# Patient Record
Sex: Female | Born: 1995 | Race: Black or African American | Hispanic: No | Marital: Single | Smoking: Never smoker
Health system: Southern US, Community
[De-identification: ages and names within clinical notes are randomized; demographics above are authoritative.]

---

## 2020-08-27 ENCOUNTER — Other Ambulatory Visit: Payer: Self-pay

## 2020-08-27 ENCOUNTER — Encounter (HOSPITAL_COMMUNITY): Payer: Self-pay

## 2020-08-27 ENCOUNTER — Emergency Department (HOSPITAL_COMMUNITY)
Admission: EM | Admit: 2020-08-27 | Discharge: 2020-08-27 | Disposition: A | Discharge status: Home / Self Care | Payer: Medicaid Other | Attending: Emergency Medicine | Admitting: Emergency Medicine

## 2020-08-27 ENCOUNTER — Emergency Department (HOSPITAL_COMMUNITY): Payer: Medicaid Other

## 2020-08-27 DIAGNOSIS — J069 Acute upper respiratory infection, unspecified: Secondary | ICD-10-CM | POA: Diagnosis not present

## 2020-08-27 DIAGNOSIS — R0981 Nasal congestion: Secondary | ICD-10-CM | POA: Diagnosis present

## 2020-08-27 DIAGNOSIS — Z20822 Contact with and (suspected) exposure to covid-19: Secondary | ICD-10-CM | POA: Diagnosis not present

## 2020-08-27 LAB — RESP PANEL BY RT-PCR (FLU A&B, COVID) ARPGX2
Influenza A by PCR: NEGATIVE
Influenza B by PCR: NEGATIVE
SARS Coronavirus 2 by RT PCR: NEGATIVE

## 2020-08-27 NOTE — ED Provider Notes (Signed)
[facility]-EMERGENCY DEPT Provider Note   CSN: [id number] Arrival date & time: 08/27/20  1527     History Chief Complaint  Patient presents with  . Shortness of Breath  . Headache  . Nasal Congestion    Breanna Randolph is a 24 y.o. female.  Breanna Randolph is a 24 y.o. female who is otherwise healthy, presents to the emergency department for evaluation of 2 days of nasal congestion, rhinorrhea, and headache.  Patient reports that she has been short of breath, but states this is because she is not able to breathe through her nose, she does not feel shortness of breath in her chest or any chest pain or tightness.  Has only had a very occasional cough.  Reports that she did have some mild sore throat when symptoms were just beginning.  Has had some chills but no measured fevers.  Patient reports that her son has been sick with similar symptoms.  She has not had her Covid vaccine, states she was scared to get it because her sister got Covid shortly after being vaccinated.  She denies any known sick contacts.  No other aggravating or alleviating factors.        History reviewed. No pertinent past medical history.  There are no problems to display for this patient.   History reviewed. No pertinent surgical history.   OB History   No obstetric history on file.     History reviewed. No pertinent family history.  Social History   Tobacco Use  . Smoking status: Never Smoker  . Smokeless tobacco: Never Used  Vaping Use  . Vaping Use: Never used  Substance Use Topics  . Alcohol use: Never  . Drug use: Never    Home Medications Prior to Admission medications   Not on File    Allergies    Patient has no known allergies.  Review of Systems   Review of Systems  Constitutional: Negative for chills and fever.  HENT: Positive for congestion, rhinorrhea, sinus pressure and sore throat.   Respiratory: Negative for cough and shortness of  breath.   Cardiovascular: Negative for chest pain.  Gastrointestinal: Negative for abdominal pain, nausea and vomiting.  Musculoskeletal: Negative for myalgias.  Neurological: Positive for headaches.  All other systems reviewed and are negative.   Physical Exam Updated Vital Signs BP (!) 133/91   Pulse 80   Temp 98 F (36.7 C) (Oral)   Resp 17   Ht 5\' 7"  (1.702 m)   Wt (!) 138.3 kg   LMP 08/27/2020 (Approximate)   SpO2 100%   BMI 47.77 kg/m   Physical Exam Vitals and nursing note reviewed.  Constitutional:      General: She is not in acute distress.    Appearance: Normal appearance. She is well-developed. She is obese. She is not ill-appearing or diaphoretic.     Comments: Well-appearing and in no distress  HENT:     Head: Normocephalic and atraumatic.     Right Ear: Tympanic membrane and ear canal normal.     Left Ear: Tympanic membrane normal.     Nose: Congestion and rhinorrhea present.     Mouth/Throat:     Mouth: Mucous membranes are moist.     Pharynx: Oropharynx is clear.     Comments: Posterior oropharynx clear and mucous membranes moist, there is mild erythema but no edema or tonsillar exudates, uvula midline, normal phonation, no trismus, tolerating secretions without difficulty. Eyes:     General:  Right eye: No discharge.        Left eye: No discharge.  Neck:     Comments: No rigidity Cardiovascular:     Rate and Rhythm: Normal rate and regular rhythm.     Heart sounds: Normal heart sounds.  Pulmonary:     Effort: Pulmonary effort is normal. No respiratory distress.     Breath sounds: Normal breath sounds.     Comments: Respirations equal and unlabored, patient able to speak in full sentences, lungs clear to auscultation bilaterally Abdominal:     General: Bowel sounds are normal. There is no distension.     Palpations: Abdomen is soft. There is no mass.     Tenderness: There is no abdominal tenderness. There is no guarding.     Comments:  Abdomen soft, nondistended, nontender to palpation in all quadrants without guarding or peritoneal signs  Musculoskeletal:        General: No deformity.     Cervical back: Neck supple.  Lymphadenopathy:     Cervical: No cervical adenopathy.  Skin:    General: Skin is warm and dry.     Capillary Refill: Capillary refill takes less than 2 seconds.  Neurological:     Mental Status: She is alert and oriented to person, place, and time.  Psychiatric:        Mood and Affect: Mood normal.        Behavior: Behavior normal.     ED Results / Procedures / Treatments   Labs (all labs ordered are listed, but only abnormal results are displayed) Labs Reviewed  RESP PANEL BY RT-PCR (FLU A&B, COVID) [id number]    EKG None  Radiology DG Chest 2 View  Result Date: 08/27/2020 CLINICAL DATA:  Shortness of breath and headache with nasal congestion. EXAM: CHEST - 2 VIEW COMPARISON:  None. FINDINGS: Lungs are clear. Cardiomediastinal silhouette is normal. Prominent overlying soft tissues. Bony structures are normal. IMPRESSION: No active cardiopulmonary disease. Electronically Signed   By: Elberta Fortis M.D.   On: 08/27/2020 16:46    Procedures Procedures (including critical care time)  Medications Ordered in ED Medications - No data to display  ED Course  I have reviewed the triage vital signs and the nursing notes.  Pertinent labs & imaging results that were available during my care of the patient were reviewed by me and considered in my medical decision making (see chart for details).    MDM Rules/Calculators/A&P                         Pt presents with nasal congestion and cough. Pt is well appearing and vitals are normal. Lungs CTA on exam. Pt CXR negative for acute infiltrate. Patients symptoms are consistent with URI, likely viral etiology. Covid and flu tests pending. Discussed that antibiotics are not indicated for viral infections. Pt will be discharged with symptomatic treatment.   Verbalizes understanding and is agreeable with plan. Pt is hemodynamically stable & in NAD prior to dc. Return precautions discussed, pt expresses understanding and agrees with plan.   Final Clinical Impression(s) / ED Diagnoses Final diagnoses:  Upper respiratory tract infection, unspecified type    Rx / DC Orders ED Discharge Orders    None       Dartha Lodge, [place] 08/28/20 [id number]    Milagros Loll, MD 08/28/20 [phone]

## 2020-08-27 NOTE — ED Triage Notes (Signed)
Patient c/o SOB, headache, and nasal congestion. patient states she was scheduled to be tested for Covid in 2 days.

## 2020-08-27 NOTE — Discharge Instructions (Signed)
Your symptoms are likely caused by a viral upper respiratory infection.  You have Covid and flu test pending will be called by phone if any of these results are positive.  Antibiotics are not helpful in treating viral infection, the virus should run its course in about 5-7 days. Please make sure you are drinking plenty of fluids. You can treat your symptoms supportively with tylenol/ibuprofen for fevers and pains, Sudafed, or other over-the-counter decongestants, Zyrtec and Flonase to help with nasal congestion, and over the counter cough syrups like Mucinex and throat lozenges to help with cough. If your symptoms are not improving please follow up with you Primary doctor.   If you develop persistent fevers, shortness of breath or difficulty breathing, chest pain, severe headache and neck pain, persistent nausea and vomiting or other new or concerning symptoms return to the Emergency department.  If your Covid test is negative today I highly encourage you to get vaccinated for Covid.

## 2022-05-03 IMAGING — CR DG CHEST 2V
2 series · 2 of 2 positions shown · non-contrast
Comparison: None.

CLINICAL DATA: Shortness of breath and headache with nasal
congestion.

EXAM:
CHEST - 2 VIEW

[w chest pa]
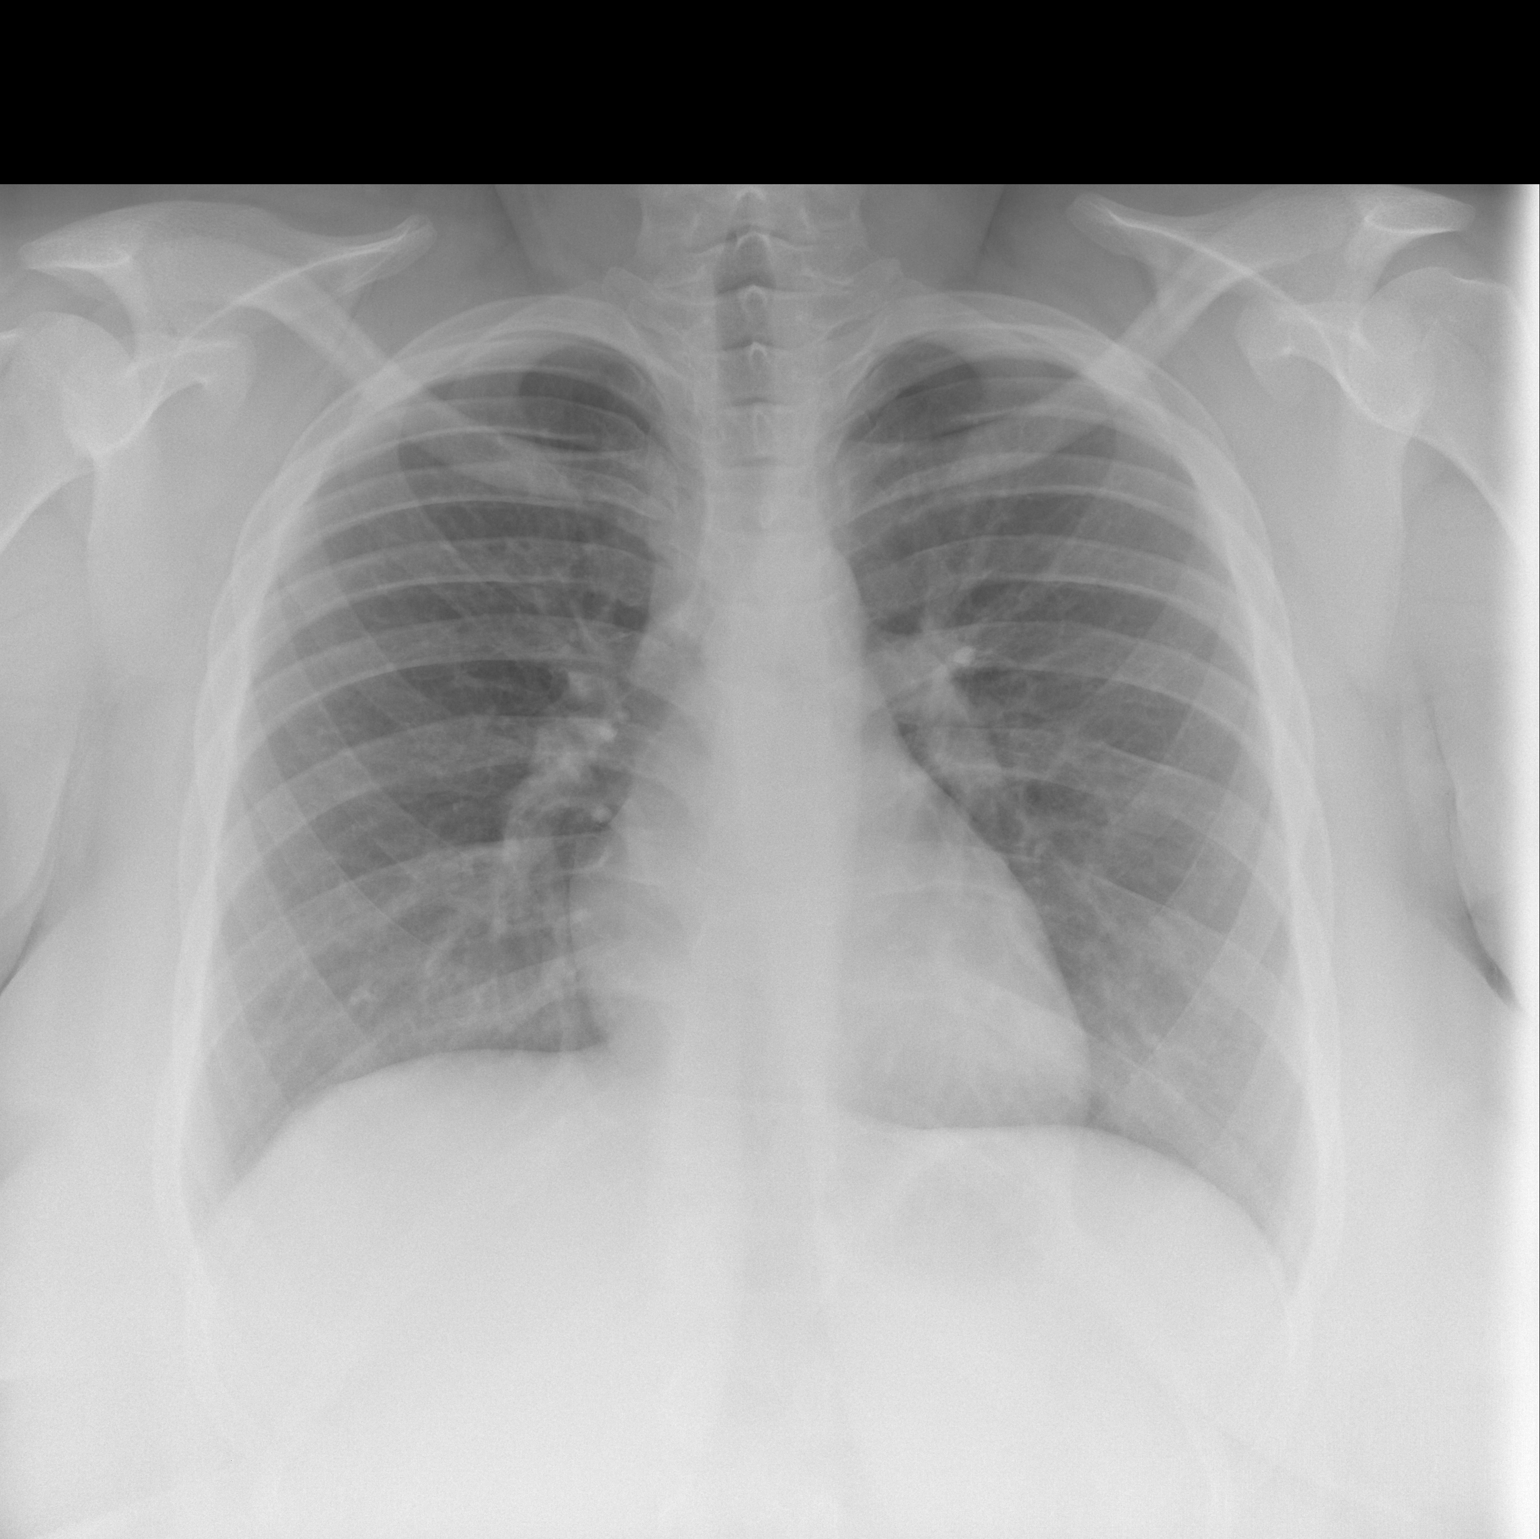

[w chest lat]
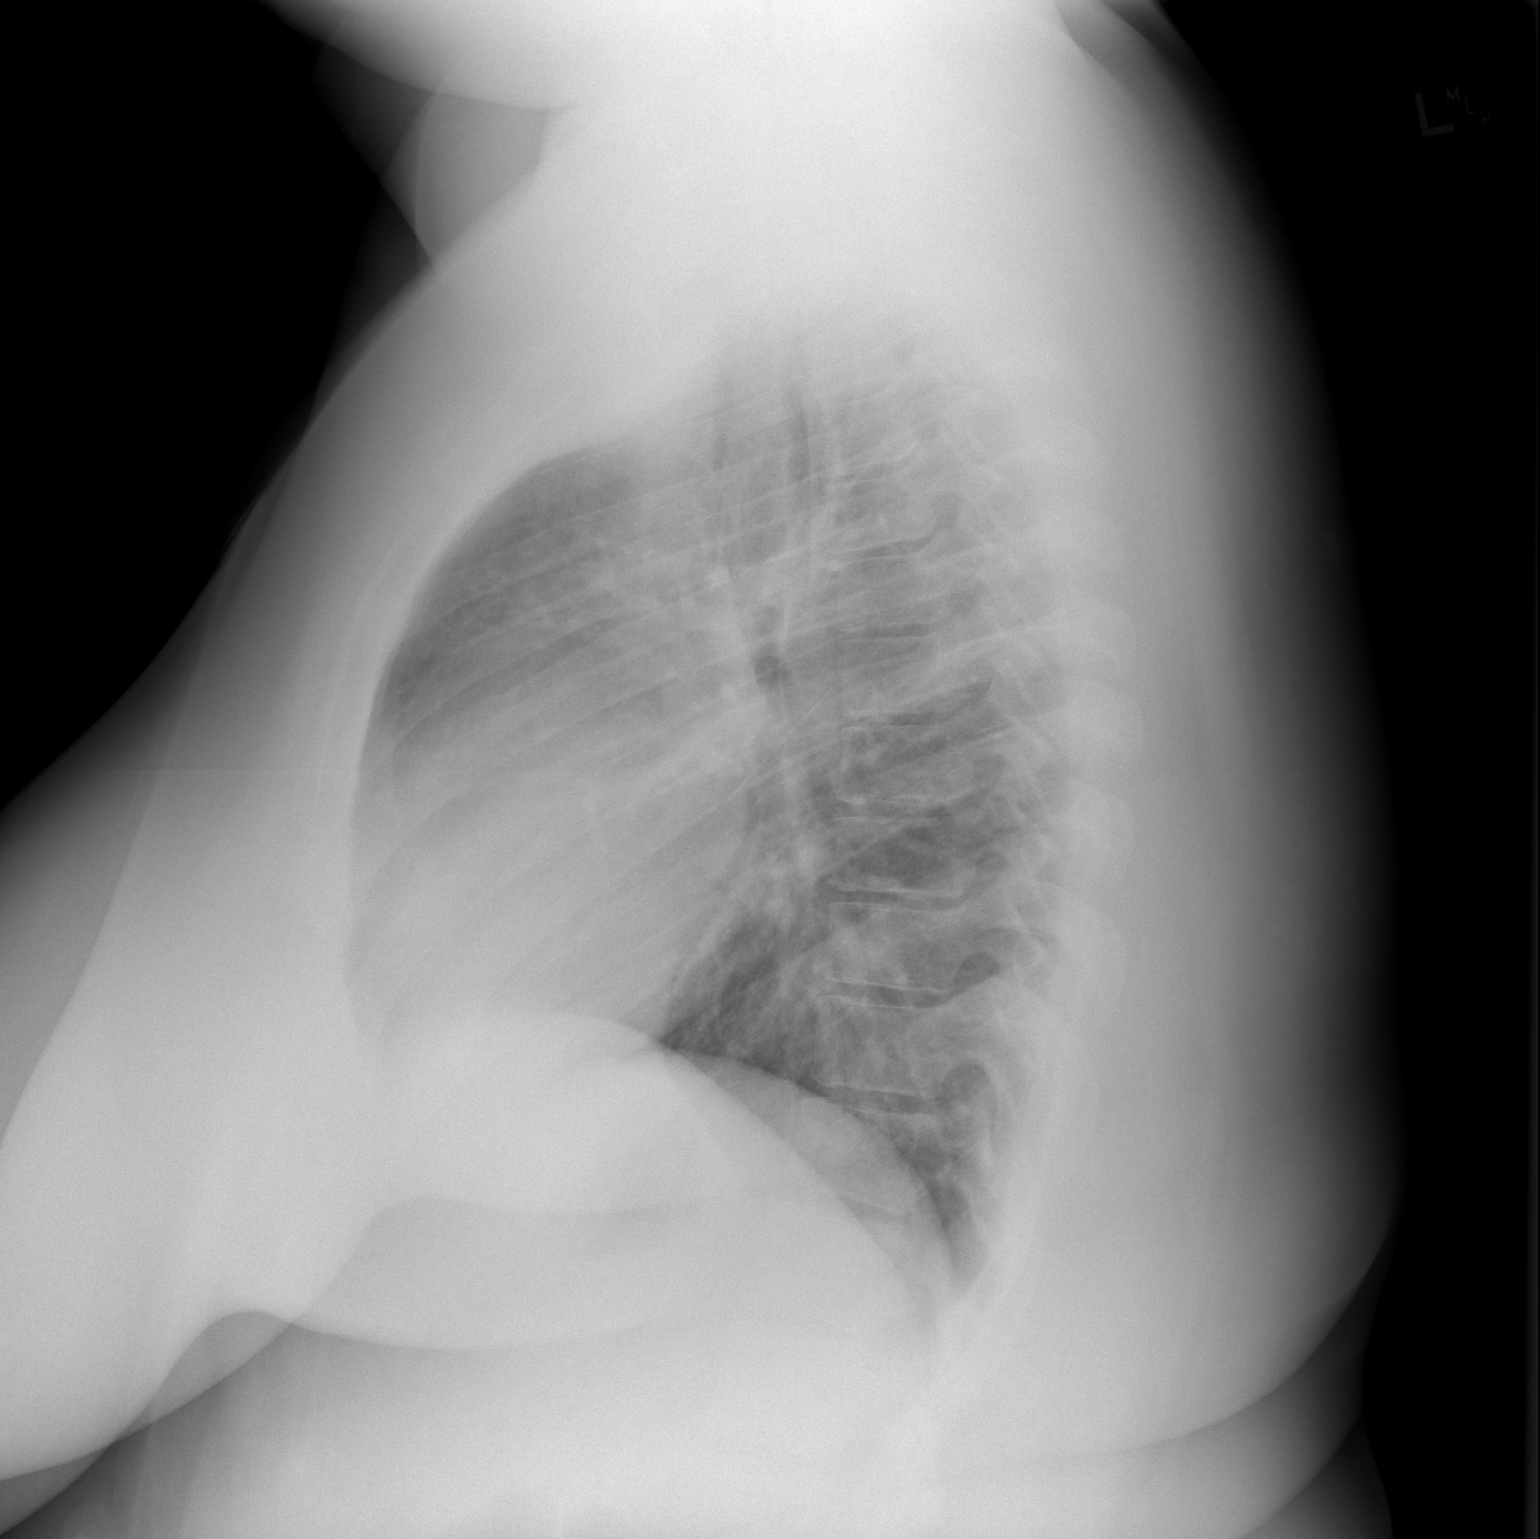

[2 of 2 positions shown; findings below may reference images not displayed]

FINDINGS: Lungs are clear. Cardiomediastinal silhouette is normal. Prominent
overlying soft tissues. Bony structures are normal.
IMPRESSION: No active cardiopulmonary disease.
